# Patient Record
Sex: Male | Born: 2000 | Race: White | Hispanic: No | Marital: Single
Health system: Southern US, Community
[De-identification: ages and names within clinical notes are randomized; demographics above are authoritative.]

---
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
## 2020-10-30 IMAGING — DX PORTABLE CHEST - 1 VIEW
1 series · 1 of 1 positions shown · non-contrast
Comparison: None.

CLINICAL DATA: Patient complains of sore gums, sore throat, and
fever since [REDACTED]. Emesis began today, describes emesis as
yellow and without blood. Denies abdominal pain, reports watery
diarrhea starting yesterday. Reports headache but denies today.
Patient is alert and oriented and in no apparent distress at this
time. Denies knowingly being around anyone positive for [id number] but
states he works at [employer] and masks are not worn consistently.Urrea
coronavirus test ordered. Results pending.

EXAM:
PORTABLE CHEST 1 VIEW

[chest ap]
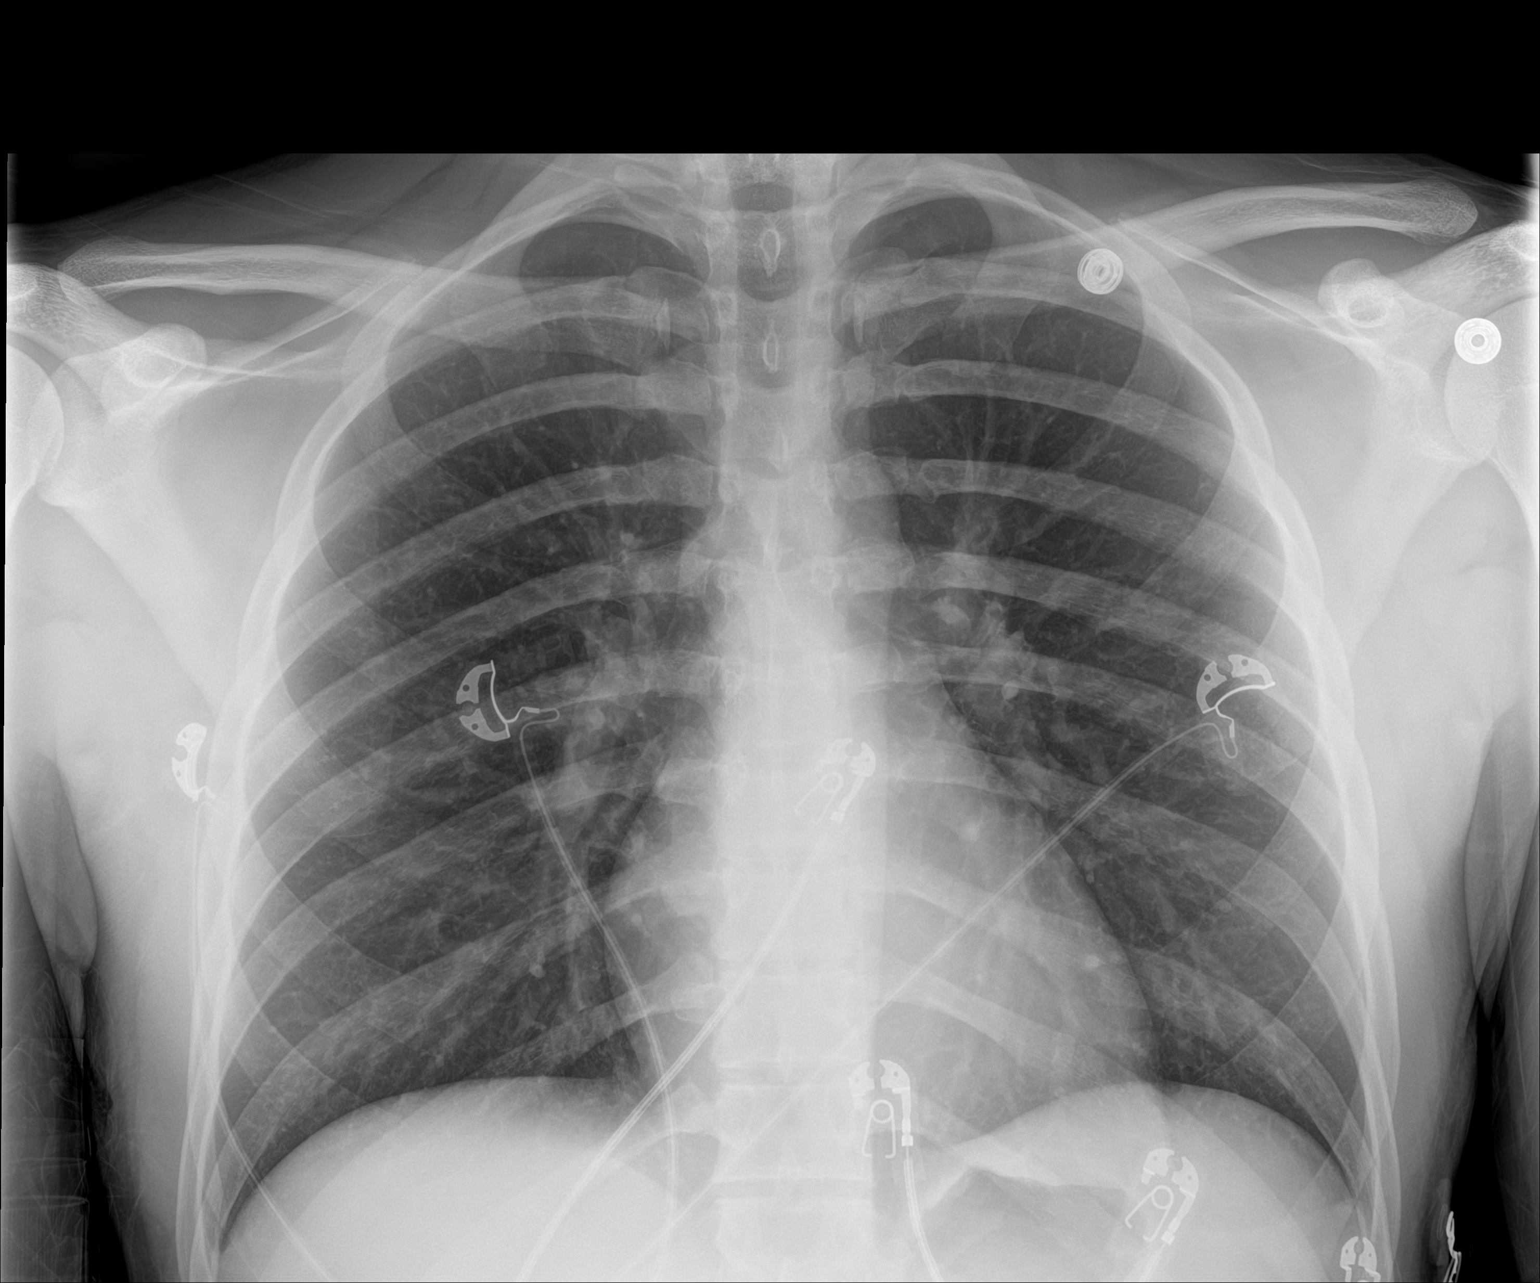

[1 of 1 positions shown; findings below may reference images not displayed]

FINDINGS: Normal heart, mediastinum and hila.

Lungs are clear and are symmetrically aerated.

No pleural effusion or pneumothorax.

Skeletal structures are unremarkable.
IMPRESSION: No active disease.
